# Patient Record
Sex: Male | Born: 1960 | Race: White | Hispanic: No | Marital: Married | Smoking: Current some day smoker
Health system: Southern US, Community
[De-identification: ages and names within clinical notes are randomized; demographics above are authoritative.]

## PROBLEM LIST (undated history)

## (undated) DIAGNOSIS — J301 Allergic rhinitis due to pollen: Secondary | ICD-10-CM

## (undated) DIAGNOSIS — Z720 Tobacco use: Secondary | ICD-10-CM

## (undated) HISTORY — DX: Allergic rhinitis due to pollen: J30.1

## (undated) HISTORY — DX: Tobacco use: Z72.0

---

## 2004-08-14 ENCOUNTER — Ambulatory Visit: Payer: Self-pay | Admitting: Internal Medicine

## 2005-11-06 ENCOUNTER — Ambulatory Visit: Payer: Self-pay | Admitting: Internal Medicine

## 2006-11-25 ENCOUNTER — Ambulatory Visit: Payer: Self-pay | Admitting: Internal Medicine

## 2006-12-01 ENCOUNTER — Ambulatory Visit: Payer: Self-pay | Admitting: Internal Medicine

## 2006-12-30 DIAGNOSIS — F17200 Nicotine dependence, unspecified, uncomplicated: Secondary | ICD-10-CM

## 2006-12-30 DIAGNOSIS — J301 Allergic rhinitis due to pollen: Secondary | ICD-10-CM | POA: Insufficient documentation

## 2007-03-17 ENCOUNTER — Encounter: Payer: Self-pay | Admitting: Internal Medicine

## 2008-04-28 ENCOUNTER — Telehealth: Payer: Self-pay | Admitting: Internal Medicine

## 2008-05-12 ENCOUNTER — Ambulatory Visit: Payer: Self-pay | Admitting: Internal Medicine

## 2008-05-13 ENCOUNTER — Ambulatory Visit: Payer: Self-pay | Admitting: Internal Medicine

## 2009-02-03 ENCOUNTER — Ambulatory Visit: Payer: Self-pay | Admitting: Internal Medicine

## 2010-03-14 ENCOUNTER — Ambulatory Visit: Payer: Self-pay | Admitting: Internal Medicine

## 2010-03-15 ENCOUNTER — Ambulatory Visit
Admission: RE | Admit: 2010-03-15 | Discharge: 2010-03-15 | Payer: Self-pay | Source: Home / Self Care | Attending: Internal Medicine | Admitting: Internal Medicine

## 2010-03-15 ENCOUNTER — Ambulatory Visit: Payer: Self-pay | Admitting: Internal Medicine

## 2010-03-21 ENCOUNTER — Telehealth (INDEPENDENT_AMBULATORY_CARE_PROVIDER_SITE_OTHER): Payer: Self-pay | Admitting: *Deleted

## 2010-04-19 NOTE — Assessment & Plan Note (Signed)
Summary: Chris Guerra   Primary Provider/Referring Provider:  in [place]  CC:  follow up visit-allergies..  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  He was being followed at [facility], where he was last seen in 2005.  He continues allergy vaccine at 1:10, being given by his wife who is an LPN.  They have been separating vials for injection, but he dropped off while he was on vacation a month ago and wants to restart. He had failed Wellbutrin for cigarette-smoking.  We reviewed risk, benefit and goal considerations of allergy vaccine including anaphylaxis, EpiPen and issues related to administration outside of a medical office.  He wishes to continue.   05/12/08- Rhinitis, tobacco Still smoking- discussed. Here for allergy vaccine f/u- definitely helps. No need for otc meds.  We discussed risk benefit of allergy vaccine and considerations in getting back to rhythm of administration. Denies active discomfort with nasal congestion, sneezing drainage, cough, wheeze or chest pain.  March 15, 2010-  Rhinitis, tobacco Nurse-CC: follow up visit-allergies. Says he has done well with no acute problems, acute issues or other health problems.  Says he can't make it without his allergy shots and denies any reactions or problems with them.  Wife gives his vaccine at 0.5/ vial of 1:10. He ran out a couple of months ago and has restarted at 0.1 to build as directed.  His PCP in [place] gets CXR with his physicals.     Preventive Screening-Counseling & Management  Alcohol-Tobacco     Smoking Status: current     Smoking Cessation Counseling: yes     Packs/Day: 1ppd     Tobacco Counseling: to quit use of tobacco products  Current Medications (verified): 1)  Lipitor 10 Mg  Tabs (Atorvastatin Calcium) .... Check Dosage 2)  Epipen 0.3 Mg/0.37ml (1:1000)  Devi  (Epinephrine Hcl (Anaphylaxis)) .... Use in Case of Severe Allergic Reaction 3)  Clarinex 5 Mg  Tabs (Desloratadine) .... Use Prn 4)  Allergy Vaccine .... 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  Teacher physical education  Risk Factors: Smoking Status: current (03/15/2010) Packs/Day: 1ppd (03/15/2010)  Social History: Packs/Day:  1ppd  Review of Systems      See HPI       The patient complains of nasal congestion/difficulty breathing through nose.  The patient denies shortness of breath with activity, shortness of breath at rest, productive cough, non-productive cough, coughing up blood, chest pain, irregular heartbeats, acid heartburn, indigestion, loss of appetite, weight change, abdominal pain, difficulty swallowing, sore throat, tooth/dental problems, headaches, and sneezing.    Vital Signs:  Patient profile:   50 year old male Height:      69 inches Weight:      191.50 pounds BMI:     28.38 O2 Sat:      99 % on Room air Pulse rate:   76 / minute BP sitting:   106 / 82  (left arm) Cuff size:   regular  Vitals Entered By: Reynaldo Minium CMA (March 15, 2010 3:13 PM)  O2 Flow:  Room air  CC: follow up visit-allergies.   Physical Exam  Additional Exam:  General: A/Ox3; pleasant and cooperative, NAD, medium build SKIN: no rash, lesions NODES: no lymphadenopathy HEENT: [place]/AT, EOM- WNL, Conjuctivae- clear, PERRLA, TM-WNL, Nose- mucoid rhinitis, L>R, Throat- clear and wnl, Mallampati  II-III NECK: Supple w/ fair ROM, JVD- none, normal carotid impulses w/o bruits Thyroid- normal to  palpation CHEST: Clear to P&A, diminished HEART: RRR, no m/g/r heard ABDOMEN: not overweight OAC:ZYSA, nl pulses, no edema  NEURO: Grossly intact to observation      Impression & Recommendations:  Problem # 1:  RHINITIS, ALLERGIC, DUE TO POLLEN (ICD-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 Updating Epipen script.  Problem # 2:  TOBACCO ABUSE (ICD-305.1)  Chronic smoker, trying to cut down. Suggest patches.  I asked about last CXR- was done over a year and a half ago. He asks about doing one now while he is here. No acute concerning changes reported.   Other Orders: Est. Patient Level III (63016) T-2 View CXR (71020TC)  Patient Instructions: 1)  Please schedule a follow-up appointment in 1 year. 2)  A chest x-ray has been recommended.  Your imaging study may require preauthorization.  3)  Continue allergy vaccine. Epipen script updated.  Prescriptions: EPIPEN 0.3 MG/0.3ML (1:1000)  DEVI (EPINEPHRINE HCL (ANAPHYLAXIS)) USE IN CASE OF SEVERE ALLERGIC REACTION  #1 x prn   Entered and Authorized by:   Waymon Budge MD   Signed by:   Waymon Budge MD on 03/15/2010   Method used:   Print then Give to Patient   RxID:   [id number]

## 2010-04-19 NOTE — Progress Notes (Signed)
Summary: returned call  Phone Note Call from Patient   Caller: Patient Call For: young Summary of Call: pt returned call from [facility] re: results [phone] Initial call taken by: Tivis Ringer, CNA,  March 21, 2010 3:38 PM  Follow-up for Phone Call        Left message on cell number that results were normal if any questions or concerns to call the office.Reynaldo Minium CMA  March 22, 2010 10:43 AM

## 2010-07-31 NOTE — Assessment & Plan Note (Signed)
[facility]                             PULMONARY OFFICE NOTE   [name], [name]                  MRN:          [id number]  DATE:11/25/2006                            DOB:          06/23/1960    PROBLEMS:  1. Allergic rhinitis.  2. Tobacco use.   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  He was being followed at  [facility], where he was last seen  in 2005.  He continues allergy vaccine at 1:10, being given by his wife  who is an LPN.  They have been separating vials for injection, but he  dropped off while he was on vacation a month ago and wants to restart.  He had failed Wellbutrin for cigarette-smoking.  We reviewed risk,  benefit and goal considerations of allergy vaccine including  anaphylaxis, EpiPen and issues related to administration outside of a  medical office.  He wishes to continue.   MEDICATIONS:  Limited to allergy vaccine and Lipitor, occasional  Clarinex.   OBJECTIVE:  Weight 185 pounds, BP 112/72, pulse 81, room air saturation  98%.  Moderate nasal congestion.  Turbinate edema.  Clear secretions.  No  polyps.  Palate spacing 3/4; not red.  No visible drainage or stridor.  HEART:  Sounds regular without murmur or gallops.  Breath sounds are diminished bilaterally.   IMPRESSION:  1. Rhinitis.  2. Bronchitis.  3. Tobacco abuse.   PLAN:  Vaccine discussion done.  Epinephrine prescription provided.  He  may get his allergy vaccine either separated into separate syringes by  vial or together.  We are going to get him restarted on his allergy  vaccine with the appropriate dosage adjustment and supervision.  Depending on the season, he can try extending allergy vaccine interval  to 1 or 2 weeks, depending on stability.  I have re-emphasized smoking  cessation and given booklet on Chantix.  He was sent for a  chest x-ray  today which shows mild bronchitis changes starting.     Chris D. Maple Hudson, MD, Chris Guerra, FACP  Electronically Signed    CDY/MedQ  DD: 11/30/2006  DT: 12/01/2006  Job #: [id number]

## 2010-11-28 ENCOUNTER — Ambulatory Visit (INDEPENDENT_AMBULATORY_CARE_PROVIDER_SITE_OTHER): Payer: Self-pay

## 2010-11-28 DIAGNOSIS — J309 Allergic rhinitis, unspecified: Secondary | ICD-10-CM

## 2011-03-15 ENCOUNTER — Ambulatory Visit: Payer: Self-pay | Admitting: Internal Medicine

## 2011-09-02 ENCOUNTER — Ambulatory Visit (INDEPENDENT_AMBULATORY_CARE_PROVIDER_SITE_OTHER): Payer: BC Managed Care – PPO

## 2011-09-02 DIAGNOSIS — J309 Allergic rhinitis, unspecified: Secondary | ICD-10-CM

## 2011-10-11 ENCOUNTER — Ambulatory Visit (INDEPENDENT_AMBULATORY_CARE_PROVIDER_SITE_OTHER): Payer: BC Managed Care – PPO | Admitting: Internal Medicine

## 2011-10-11 ENCOUNTER — Ambulatory Visit (INDEPENDENT_AMBULATORY_CARE_PROVIDER_SITE_OTHER)
Admission: RE | Admit: 2011-10-11 | Discharge: 2011-10-11 | Disposition: A | Discharge status: Home / Self Care | Payer: BC Managed Care – PPO | Source: Ambulatory Visit | Attending: Internal Medicine | Admitting: Internal Medicine

## 2011-10-11 ENCOUNTER — Encounter: Payer: Self-pay | Admitting: Internal Medicine

## 2011-10-11 VITALS — BP 106/70 | HR 79 | Ht 69.0 in | Wt 182.8 lb

## 2011-10-11 DIAGNOSIS — Z720 Tobacco use: Secondary | ICD-10-CM

## 2011-10-11 DIAGNOSIS — F17200 Nicotine dependence, unspecified, uncomplicated: Secondary | ICD-10-CM

## 2011-10-11 DIAGNOSIS — J301 Allergic rhinitis due to pollen: Secondary | ICD-10-CM

## 2011-10-11 MED ORDER — EPINEPHRINE 0.3 MG/0.3ML IJ DEVI: 0.3000 mg | Freq: Once | INTRAMUSCULAR | Status: DC

## 2011-10-11 NOTE — Patient Instructions (Addendum)
Script for Epipen/ instruction  To hold in case of severe allergic reaction to allergy shot  Ok to continue allergy vaccine  Order CXR   Dx tobacco user

## 2011-10-11 NOTE — Progress Notes (Signed)
7/26/113- 51 yoM smoker followed for allergic rhinitis, tobacco use LOV- 03/15/2010  Allergies doing well as long as taking shots.  He continues 1:10 with shots given by his wife, an LPN, without any problems. He feels he "has to have them". Unfortunately he is still smoking against advice. Denies cough or wheeze. He feels very well, exercising regularly. CXR 03/15/10 IMPRESSION:  Normal chest radiograph.  Provider: Darrelyn Hillock, Bud Face   ROS-see HPI Constitutional:   No-   weight loss, night sweats, fevers, chills, fatigue, lassitude. HEENT:   No-  headaches, difficulty swallowing, tooth/dental problems, sore throat,       No-  sneezing, itching, ear ache, nasal congestion, post nasal drip,  CV:  No-   chest pain, orthopnea, PND, swelling in lower extremities, anasarca,  dizziness, palpitations Resp: No-   shortness of breath with exertion or at rest.              No-   productive cough,  No non-productive cough,  No- coughing up of blood.              No-   change in color of mucus.  No- wheezing.   Skin: No-   rash or lesions. GI:  No-   heartburn, indigestion, abdominal pain, nausea, vomiting, GU: n. MS:  No-   joint pain or swelling.  . Neuro-     nothing unusual Psych:  No- change in mood or affect. No depression or anxiety.  No memory loss.  OBJ- Physical Exam General- Alert, Oriented, Affect-appropriate, Distress- none acute Skin- rash-none, lesions- none, excoriation- none Lymphadenopathy- none Head- atraumatic            Eyes- Gross vision intact, PERRLA, conjunctivae and secretions clear            Ears- Hearing, canals-normal            Nose- Clear, no-Septal dev, mucus, polyps, erosion, perforation             Throat- Mallampati IV , mucosa clear , drainage- none, tonsils- atrophic Neck- flexible , trachea midline, no stridor , thyroid nl, carotid no bruit Chest - symmetrical excursion , unlabored           Heart/CV- RRR , no murmur , no gallop  , no rub, nl  s1 s2                           - JVD- none , edema- none, stasis changes- none, varices- none           Lung- clear to P&A, wheeze- none, cough- none , dullness-none, rub- none           Chest wall-  Abd-  Br/ Gen/ Rectal- Not done, not indicated Extrem- cyanosis- none, clubbing, none, atrophy- none, strength- nl Neuro- grossly intact to observation

## 2011-10-16 NOTE — Progress Notes (Signed)
Quick Note:  LMTCB ______ 

## 2011-10-17 NOTE — Assessment & Plan Note (Signed)
Very concerning long-time habit. We have discussed smoking cessation, medical issues and available support. He agrees to chest x-ray.

## 2011-10-17 NOTE — Assessment & Plan Note (Signed)
Good control. He is convinced he needs to remain on allergy vaccine and has had no problems. We are refilling EpiPen with discussion.

## 2011-10-18 ENCOUNTER — Telehealth: Payer: Self-pay | Admitting: Internal Medicine

## 2011-10-18 NOTE — Telephone Encounter (Signed)
Notes Recorded by Waymon Budge, MD on 10/11/2011 at 1:37 PM CXR- minimal chronic bronchitis changes, with no acute process.  I spoke with the pt and advised of results. Carron Curie, CMA

## 2012-06-09 ENCOUNTER — Ambulatory Visit (INDEPENDENT_AMBULATORY_CARE_PROVIDER_SITE_OTHER): Payer: BC Managed Care – PPO

## 2012-06-09 DIAGNOSIS — J309 Allergic rhinitis, unspecified: Secondary | ICD-10-CM

## 2013-03-15 ENCOUNTER — Ambulatory Visit (INDEPENDENT_AMBULATORY_CARE_PROVIDER_SITE_OTHER): Payer: BC Managed Care – PPO

## 2013-03-15 DIAGNOSIS — J309 Allergic rhinitis, unspecified: Secondary | ICD-10-CM

## 2013-03-16 ENCOUNTER — Ambulatory Visit (INDEPENDENT_AMBULATORY_CARE_PROVIDER_SITE_OTHER)
Admission: RE | Admit: 2013-03-16 | Discharge: 2013-03-16 | Disposition: A | Discharge status: Home / Self Care | Payer: BC Managed Care – PPO | Source: Ambulatory Visit | Attending: Internal Medicine | Admitting: Internal Medicine

## 2013-03-16 ENCOUNTER — Ambulatory Visit (INDEPENDENT_AMBULATORY_CARE_PROVIDER_SITE_OTHER): Payer: BC Managed Care – PPO | Admitting: Internal Medicine

## 2013-03-16 ENCOUNTER — Encounter: Payer: Self-pay | Admitting: Internal Medicine

## 2013-03-16 VITALS — BP 124/76 | HR 81 | Ht 69.0 in | Wt 179.0 lb

## 2013-03-16 DIAGNOSIS — F17200 Nicotine dependence, unspecified, uncomplicated: Secondary | ICD-10-CM

## 2013-03-16 DIAGNOSIS — Z720 Tobacco use: Secondary | ICD-10-CM

## 2013-03-16 DIAGNOSIS — J301 Allergic rhinitis due to pollen: Secondary | ICD-10-CM

## 2013-03-16 MED ORDER — EPINEPHRINE 0.3 MG/0.3ML IJ SOAJ: INTRAMUSCULAR | Status: AC

## 2013-03-16 NOTE — Progress Notes (Signed)
7/26/113- 51 yoM smoker followed for allergic rhinitis, tobacco use LOV- 12smoker/29/2011  Allergies doing well as long as taking shots.  He continues 1:10 with shots given by his wife, an LPN, without any problems. He feels he "has to have them". Unfortunately he is still smoking against advice. Denies cough or wheeze. He feels very well, exercising regularly. CXR 03/15/10 IMPRESSION:  Normal chest radiograph.  Provider: Darrelyn Hillock, Liborio Nixon Swinton   03/16/13- 52 yoM  followed for allergic rhinitis, tobacco use Follows for- needing refill on allergy injections.  Pt states he "has to have them", still recieves his injections at home.  No complaints otherwise.  Allergy vaccine 1:10 GO/ given by wife-LPN He definitely wants to continue allergy vaccine. Tried Vapes, but still smoking cigarettes. Little cough. CXR 10/11/11  IMPRESSION:  Minimal chronic bronchitic changes.  No acute abnormalities.  Original Report Authenticated By: Lollie Marrow, M.D.   ROS-see HPI Constitutional:   No-   weight loss, night sweats, fevers, chills, fatigue, lassitude. HEENT:   No-  headaches, difficulty swallowing, tooth/dental problems, sore throat,       No-  sneezing, itching, ear ache, nasal congestion, post nasal drip,  CV:  No-   chest pain, orthopnea, PND, swelling in lower extremities, anasarca,  dizziness, palpitations Resp: No-   shortness of breath with exertion or at rest.              No-   productive cough,  No non-productive cough,  No- coughing up of blood.              No-   change in color of mucus.  No- wheezing.   Skin: No-   rash or lesions. GI:  No-   heartburn, indigestion, abdominal pain, nausea, vomiting, GU: n. MS:  No-   joint pain or swelling.  . Neuro-     nothing unusual Psych:  No- change in mood or affect. No depression or anxiety.  No memory loss.  OBJ- Physical Exam General- Alert, Oriented, Affect-appropriate, Distress- none acute Skin- rash-none, lesions- none,  excoriation- none Lymphadenopathy- none Head- atraumatic            Eyes- Gross vision intact, PERRLA, conjunctivae and secretions clear            Ears- Hearing, canals-normal            Nose- Clear, no-Septal dev, mucus, polyps, erosion, perforation             Throat- Mallampati IV , mucosa clear , drainage- none, tonsils- atrophic Neck- flexible , trachea midline, no stridor , thyroid nl, carotid no bruit Chest - symmetrical excursion , unlabored           Heart/CV- RRR , no murmur , no gallop  , no rub, nl s1 s2                           - JVD- none , edema- none, stasis changes- none, varices- none           Lung- + distant, unlabored, wheeze- none, cough- none , dullness-none, rub- none           Chest wall-  Abd-  Br/ Gen/ Rectal- Not done, not indicated Extrem- cyanosis- none, clubbing, none, atrophy- none, strength- nl Neuro- grossly intact to observation

## 2013-03-16 NOTE — Patient Instructions (Addendum)
Order- schedule PFT   Dx tobacco user  Order- CXR   Dx tobacco user  We can continue allergy vaccine 1:10 GO  Script printed for Epipen refill in case of severe allergic reaction  Please try harder to stop smoking before you get a heart attack or a cancer !

## 2013-04-07 NOTE — Assessment & Plan Note (Addendum)
Cessation counseling Plan-chest x-ray, schedule PFT

## 2013-04-07 NOTE — Assessment & Plan Note (Signed)
Okay to continue allergy vaccine. Refill EpiPen with discussion.

## 2013-10-04 ENCOUNTER — Telehealth: Payer: Self-pay | Admitting: Internal Medicine

## 2013-10-04 NOTE — Telephone Encounter (Signed)
No need for message. Transferred to [employer] Scott.

## 2013-10-05 ENCOUNTER — Ambulatory Visit (INDEPENDENT_AMBULATORY_CARE_PROVIDER_SITE_OTHER): Payer: BC Managed Care – PPO

## 2013-10-05 DIAGNOSIS — J309 Allergic rhinitis, unspecified: Secondary | ICD-10-CM

## 2014-01-20 ENCOUNTER — Encounter: Payer: Self-pay | Admitting: Internal Medicine

## 2014-09-09 ENCOUNTER — Telehealth: Payer: Self-pay | Admitting: Internal Medicine

## 2014-09-09 NOTE — Telephone Encounter (Signed)
lmtcb

## 2014-09-09 NOTE — Telephone Encounter (Signed)
Patient wants to get refill on Allergy Vaccine.  It has expired.  He is still using the vaccine but it has been expired since January 2016. Patient says that the vaccine is mixed here.  To Chris Guerra for follow up.

## 2014-09-09 NOTE — Telephone Encounter (Signed)
Pt returned call - [phone]

## 2014-09-09 NOTE — Telephone Encounter (Signed)
Having read Chris Guerra's note you've seen pt. Is taking exp. Vac.. Vac. Was made 10/05/13. Pt. Hasn't seen you since 03/16/2013. Judging from yesterday's ph. Note( someonelse's) it's going to be awhile before you can get him in? If so Please advise.

## 2014-09-12 NOTE — Telephone Encounter (Signed)
It is ok to continue his allergy vaccine.  We need to get him an ROV this summer sometime

## 2014-09-12 NOTE — Telephone Encounter (Signed)
Date Mixed: 09/12/2014 Vial: AB Strength: 1:10 Here/Mail/Pick Up: Mail Mixed By: Jaynee EaglesLindsay Krystofer Hevener, CMA  lmtcb x1 for pt to make ROV with CY.

## 2014-09-13 ENCOUNTER — Ambulatory Visit (INDEPENDENT_AMBULATORY_CARE_PROVIDER_SITE_OTHER): Payer: BC Managed Care – PPO

## 2014-09-13 DIAGNOSIS — J309 Allergic rhinitis, unspecified: Secondary | ICD-10-CM | POA: Diagnosis not present

## 2014-09-13 NOTE — Telephone Encounter (Signed)
Called pt. To let him know he will need to make an appt. To see Dr.Young and keep it before we can send him anymore vac.. Dr.Young okayed this because he booked for 2 or so months out. Hopefully pt. Will call and make an appt. So we can close this encounter.

## 2014-09-13 NOTE — Telephone Encounter (Signed)
lmomtcb x2 for pt 

## 2014-09-14 NOTE — Telephone Encounter (Signed)
lmtcb for pt.  

## 2014-09-15 NOTE — Telephone Encounter (Signed)
As of my note of 1:12- ok to continue his allergy vaccine. This is ok until we can see him in October or whenever.

## 2014-09-15 NOTE — Telephone Encounter (Signed)
lmtcb for pt.   Next available opening for CY is 12/21/2014.  Dr. Maple HudsonYoung are you ok with pt waiting that long to be seen?  Thanks!

## 2014-09-15 NOTE — Telephone Encounter (Signed)
If he can't be seen by me once a year, we will not refill his allergy vaccine once the current supply runs out.

## 2014-09-15 NOTE — Telephone Encounter (Signed)
Spoke with pt, states he did not wish to schedule an appt at this time d/t cost.  I advised that CY is booked out until October and would have to be scheduled at that time. He refused, stating that he would call back at a later time to schedule a follow up.  Pt has his new vaccine.    Forwarding to CY as fyi.  Nothing further needed at this time.

## 2014-09-21 NOTE — Telephone Encounter (Signed)
Called pt.09/16/14 lm with voice mail. Called today to make sure pt. Received message (he did). Gave pt. Dr.Young's rec.s he understood and is making an appt. Now. (I transferred him to 801) nothing further needed.

## 2014-11-04 ENCOUNTER — Encounter: Payer: Self-pay | Admitting: Internal Medicine

## 2015-01-02 ENCOUNTER — Ambulatory Visit: Payer: Self-pay | Admitting: Internal Medicine

## 2015-03-09 ENCOUNTER — Encounter: Payer: Self-pay | Admitting: Internal Medicine

## 2015-03-09 ENCOUNTER — Ambulatory Visit (INDEPENDENT_AMBULATORY_CARE_PROVIDER_SITE_OTHER)
Admission: RE | Admit: 2015-03-09 | Discharge: 2015-03-09 | Disposition: A | Discharge status: Home / Self Care | Payer: BC Managed Care – PPO | Source: Ambulatory Visit | Attending: Internal Medicine | Admitting: Internal Medicine

## 2015-03-09 ENCOUNTER — Ambulatory Visit (INDEPENDENT_AMBULATORY_CARE_PROVIDER_SITE_OTHER): Payer: BC Managed Care – PPO | Admitting: Internal Medicine

## 2015-03-09 VITALS — BP 98/62 | HR 71 | Ht 69.0 in | Wt 183.4 lb

## 2015-03-09 DIAGNOSIS — F17200 Nicotine dependence, unspecified, uncomplicated: Secondary | ICD-10-CM

## 2015-03-09 DIAGNOSIS — Z23 Encounter for immunization: Secondary | ICD-10-CM

## 2015-03-09 NOTE — Patient Instructions (Signed)
Please try harder to stop smoking before it stops you !  We can continue allergy vaccine another year  Order- CXR  Dx Tobacco user  Please call if we can help

## 2015-03-09 NOTE — Progress Notes (Signed)
7/26/113- 51 yoM smoker followed for allergic rhinitis, tobacco use LOV- 12smoker/29/2011  Allergies doing well as long as taking shots.  He continues 1:10 with shots given by his wife, an LPN, without any problems. He feels he "has to have them". Unfortunately he is still smoking against advice. Denies cough or wheeze. He feels very well, exercising regularly. CXR 03/15/10 IMPRESSION:  Normal chest radiograph.  Provider: Darrelyn HillockKristin Johnson, Liborio NixonJanice Swinton   03/16/13- 52 yoM  followed for allergic rhinitis, tobacco use Follows for- needing refill on allergy injections.  Pt states he "has to have them", still recieves his injections at home.  No complaints otherwise.  Allergy vaccine 1:10 GO/ given by wife-LPN He definitely wants to continue allergy vaccine. Tried Vapes, but still smoking cigarettes. Little cough. CXR 10/11/11  IMPRESSION:  Minimal chronic bronchitic changes.  No acute abnormalities.  Original Report Authenticated By: Lollie MarrowMARK A. BOLES, M.D.  03/09/2015-54 year old male smoker followed for allergic rhinitis FOLLOWS FOR: Still taking allergy vaccines, no complaints.  Allergy vaccine 1:10 GO/wife LPN CXR 29/92/426812/30/2014 IMPRESSION: No active cardiopulmonary disease. Electronically Signed  By: Alcide CleverMark Lukens M.D.  On: 03/16/2013 11:15    ROS-see HPI Constitutional:   No-   weight loss, night sweats, fevers, chills, fatigue, lassitude. HEENT:   No-  headaches, difficulty swallowing, tooth/dental problems, sore throat,       No-  sneezing, itching, ear ache, nasal congestion, post nasal drip,  CV:  No-   chest pain, orthopnea, PND, swelling in lower extremities, anasarca,  dizziness, palpitations Resp: No-   shortness of breath with exertion or at rest.              No-   productive cough,  No non-productive cough,  No- coughing up of blood.              No-   change in color of mucus.  No- wheezing.   Skin: No-   rash or lesions. GI:  No-   heartburn, indigestion, abdominal  pain, nausea, vomiting, GU: n. MS:  No-   joint pain or swelling.  . Neuro-     nothing unusual Psych:  No- change in mood or affect. No depression or anxiety.  No memory loss.  OBJ- Physical Exam General- Alert, Oriented, Affect-appropriate, Distress- none acute Skin- rash-none, lesions- none, excoriation- none Lymphadenopathy- none Head- atraumatic            Eyes- Gross vision intact, PERRLA, conjunctivae and secretions clear            Ears- Hearing, canals-normal            Nose- Clear, no-Septal dev, mucus, polyps, erosion, perforation             Throat- Mallampati IV , mucosa clear , drainage- none, tonsils- atrophic Neck- flexible , trachea midline, no stridor , thyroid nl, carotid no bruit Chest - symmetrical excursion , unlabored           Heart/CV- RRR , no murmur , no gallop  , no rub, nl s1 s2                           - JVD- none , edema- none, stasis changes- none, varices- none           Lung- + distant, unlabored, wheeze- none, cough- none , dullness-none, rub- none           Chest wall-  Abd-  Br/ Gen/ Rectal-  Not done, not indicated Extrem- cyanosis- none, clubbing, none, atrophy- none, strength- nl Neuro- grossly intact to observation

## 2015-03-13 IMAGING — CR DG CHEST 2V
2 series · 2 of 2 positions shown · non-contrast
Comparison: 10/11/2011

CLINICAL DATA: History of tobacco use, a yearly physical

EXAM:
CHEST  2 VIEW

[view not recorded (1 of 2)]
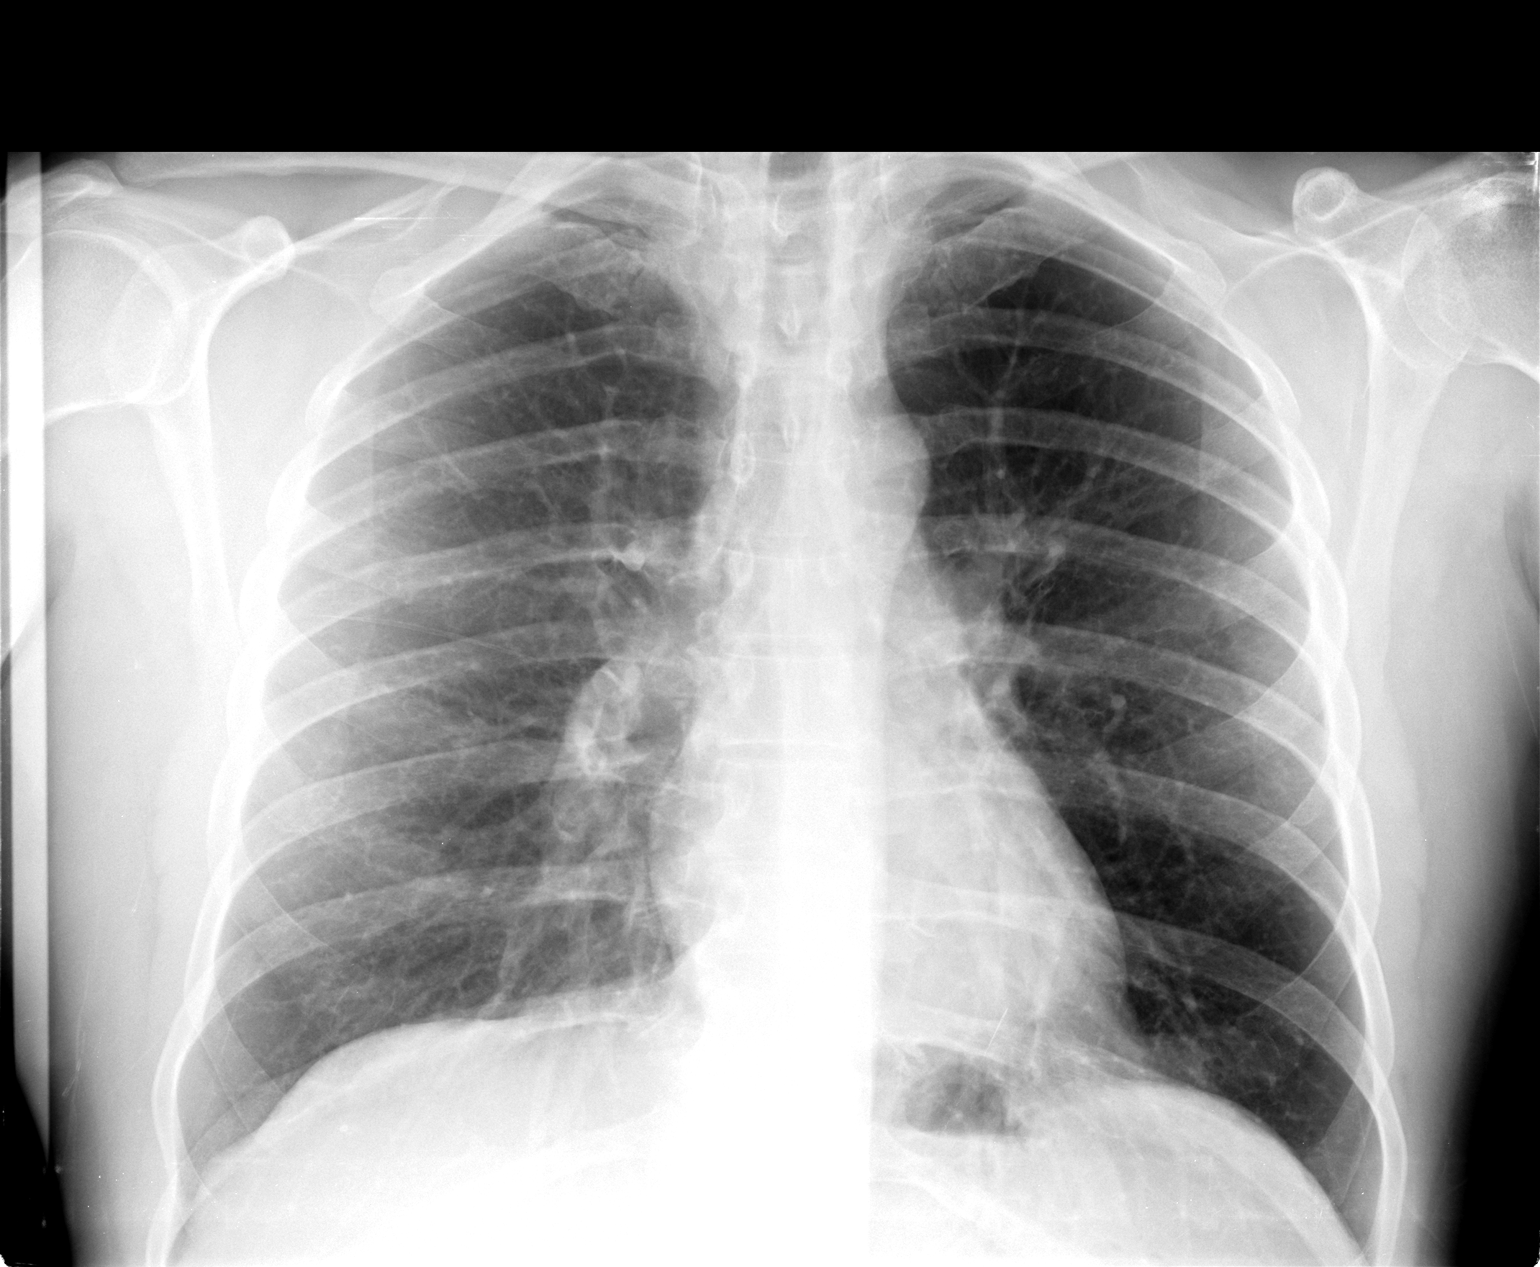

[view not recorded (2 of 2)]
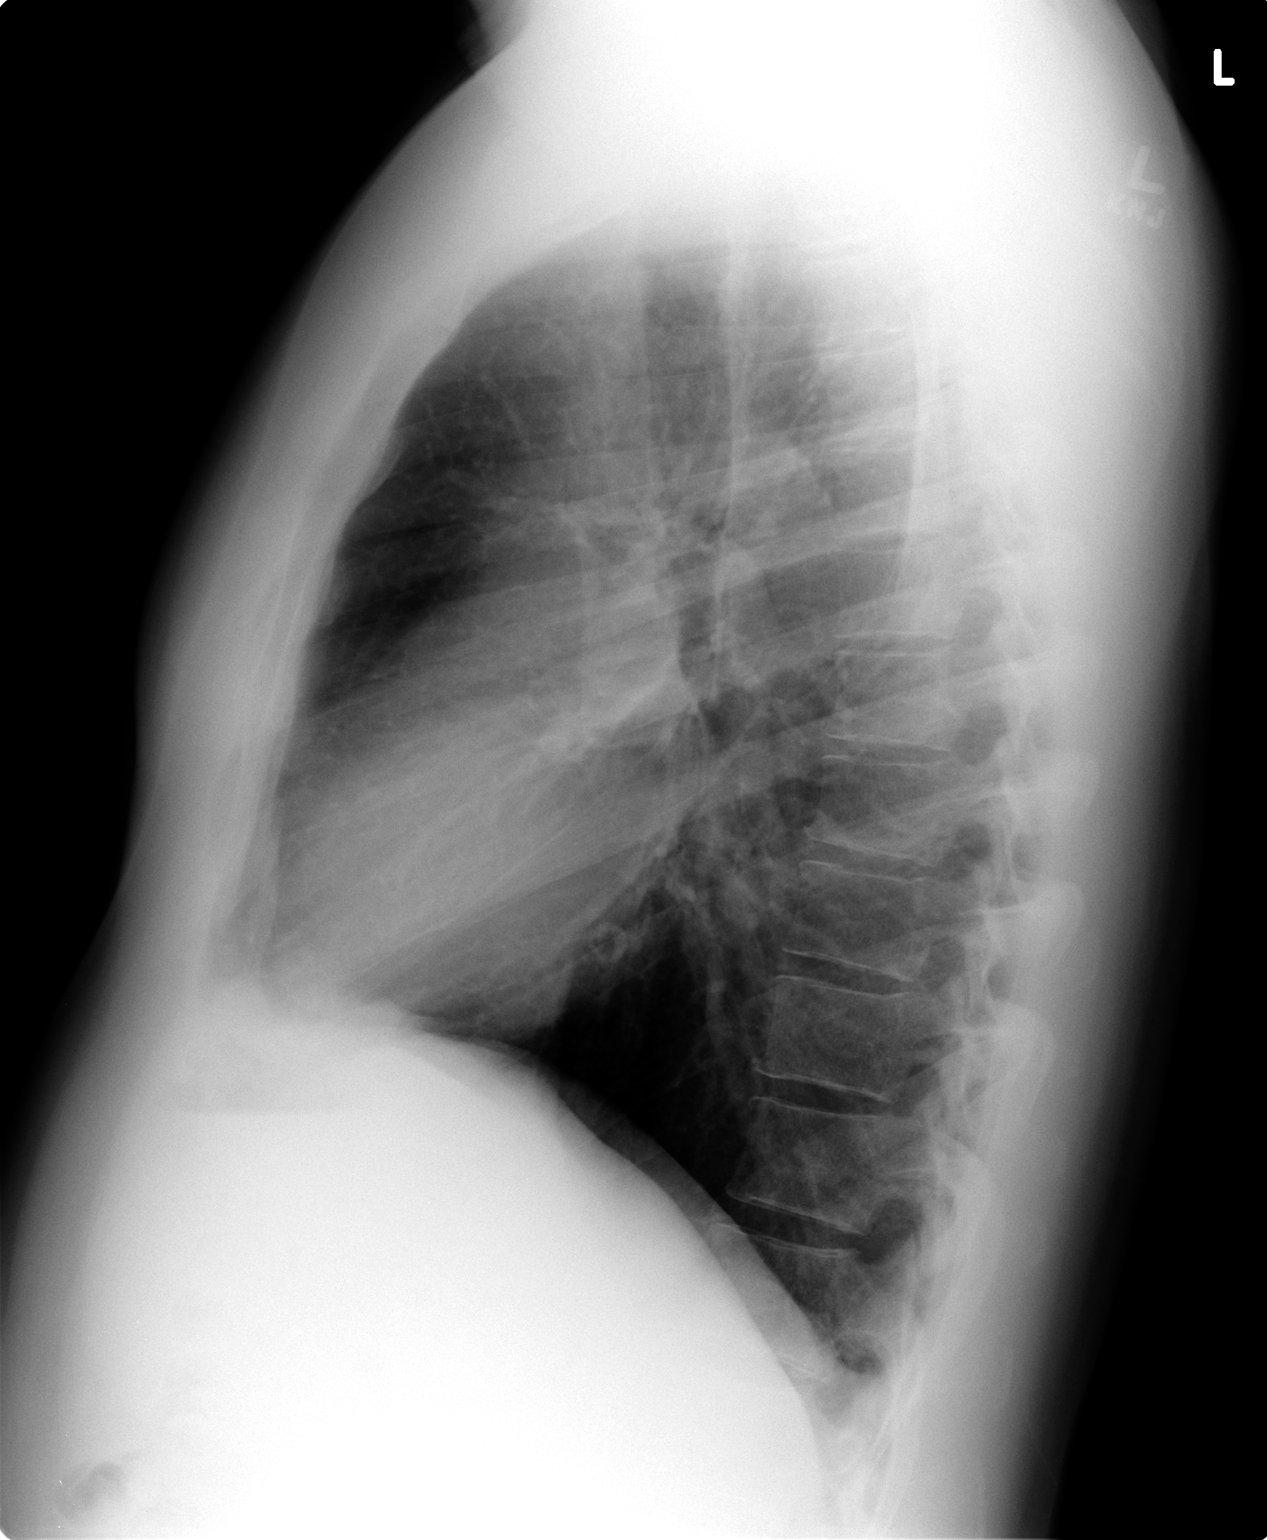

[2 of 2 positions shown; findings below may reference images not displayed]

FINDINGS: The heart size and mediastinal contours are within normal limits.
Both lungs are clear. The visualized skeletal structures are
unremarkable.
IMPRESSION: No active cardiopulmonary disease.

## 2015-05-22 ENCOUNTER — Telehealth: Payer: Self-pay | Admitting: Internal Medicine

## 2015-05-22 NOTE — Telephone Encounter (Signed)
Will leave encounter open to record. Vac. Once it is made.

## 2015-05-23 NOTE — Telephone Encounter (Signed)
Allergy Serum Extract Date Mixed: 05/23/15 Vial: 2 Strength: 1:10 Here/Mail/Pick Up: mail Mixed By: tbs Last OV: 03/09/15 Pending OV: 03/20/16

## 2015-05-24 DIAGNOSIS — J309 Allergic rhinitis, unspecified: Secondary | ICD-10-CM | POA: Diagnosis not present

## 2015-12-29 ENCOUNTER — Telehealth: Payer: Self-pay | Admitting: Internal Medicine

## 2015-12-29 NOTE — Telephone Encounter (Signed)
Spoke with patient-aware that TS is back on Tuesday 01/02/16 to mix and will let him know once allergy vaccine has been mailed.

## 2016-01-04 ENCOUNTER — Telehealth: Payer: Self-pay | Admitting: Internal Medicine

## 2016-01-04 DIAGNOSIS — J309 Allergic rhinitis, unspecified: Secondary | ICD-10-CM | POA: Diagnosis not present

## 2016-01-04 NOTE — Telephone Encounter (Signed)
Allergy Serum Extract Date Mixed: 01/04/16 Vial: 2 Strength: 1:10 Here/Mail/Pick Up: mail Mixed By: tbs Last OV: 03/09/15 Pending OV: 03/20/16

## 2016-01-05 NOTE — Telephone Encounter (Signed)
Called pt. This morning lmom vac. Is in the mail. He should get it over the weekend.Nothing further needed.

## 2016-03-20 ENCOUNTER — Ambulatory Visit: Payer: BC Managed Care – PPO | Admitting: Internal Medicine

## 2017-03-05 IMAGING — DX DG CHEST 2V
2 series · 2 of 2 positions shown · non-contrast
Comparison: 03/16/2013

CLINICAL DATA: Tobacco use disorder

EXAM:
CHEST  2 VIEW

[chest pa]
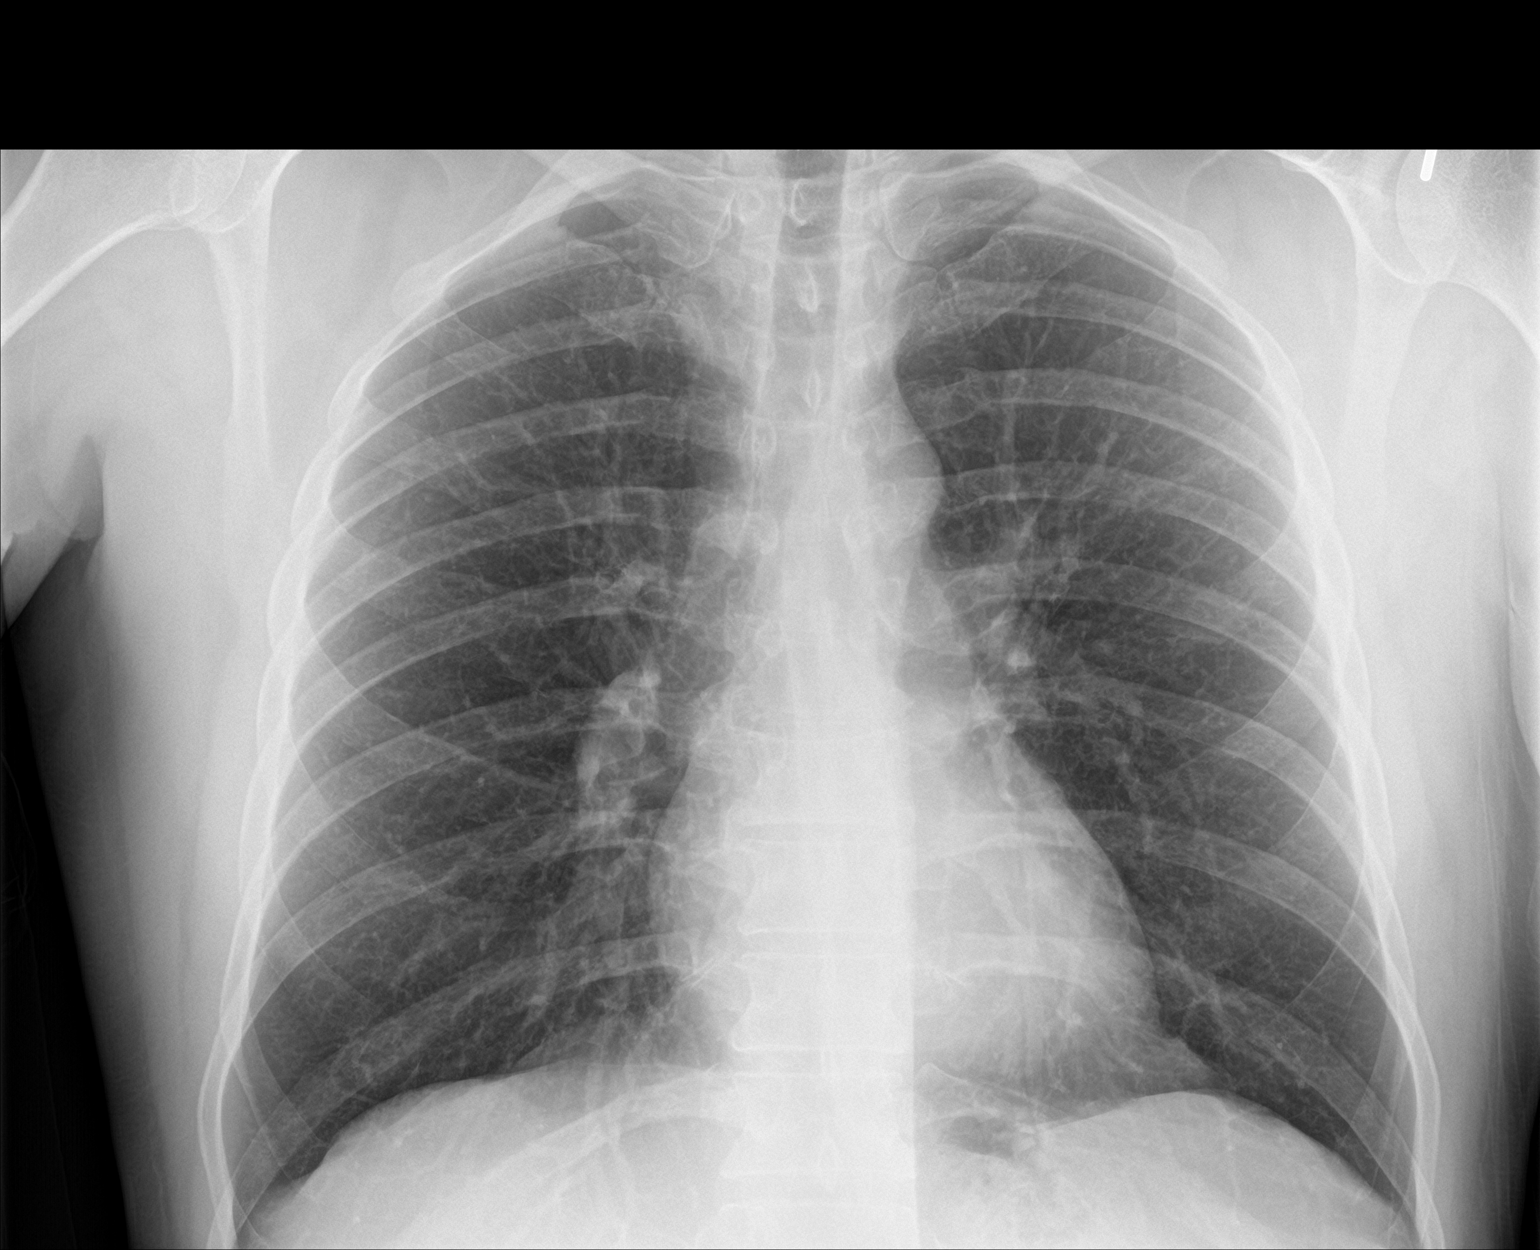

[chest lat]
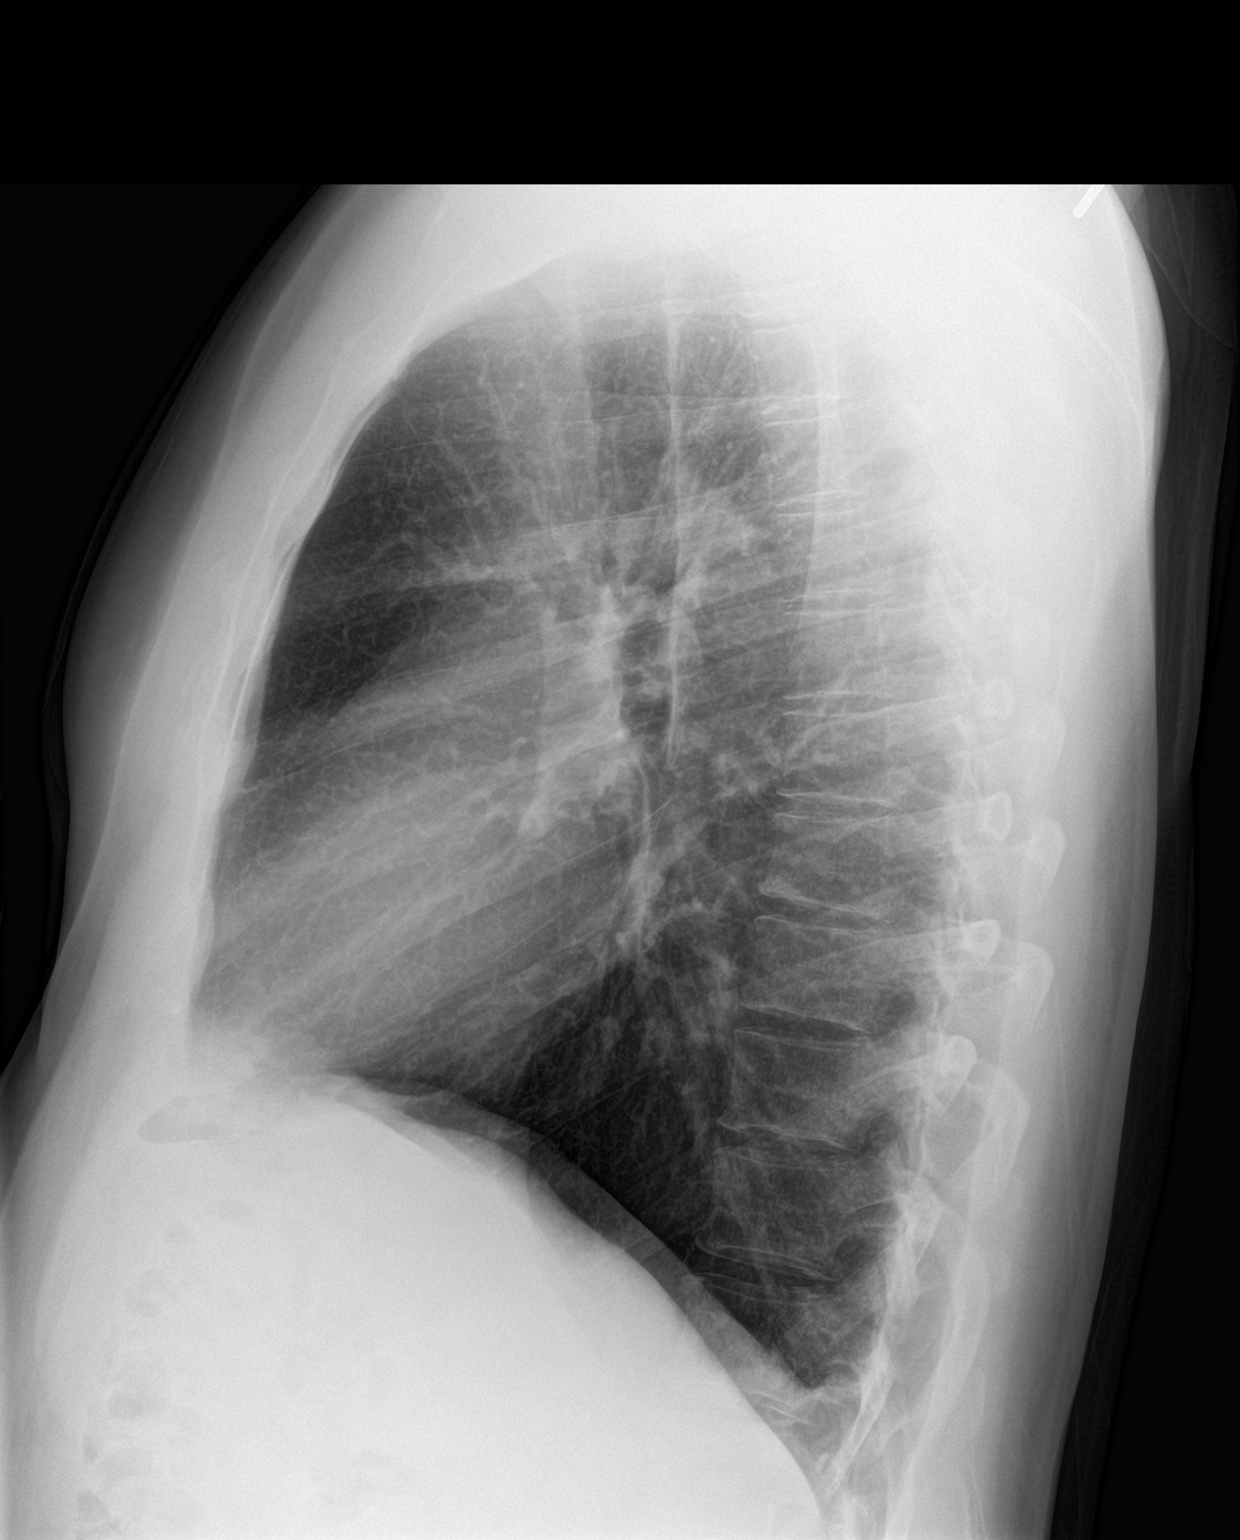

[2 of 2 positions shown; findings below may reference images not displayed]

FINDINGS: The heart size and mediastinal contours are within normal limits.
Both lungs are clear. The visualized skeletal structures are
unremarkable.
IMPRESSION: No active cardiopulmonary disease.

## 2023-12-11 NOTE — Progress Notes (Signed)
 This encounter was created in error - please disregard.
